# Patient Record
Sex: Female | Born: 2000 | Race: Black or African American | Hispanic: No | Marital: Single | Smoking: Never smoker
Health system: Southern US, Community
[De-identification: ages and names within clinical notes are randomized; demographics above are authoritative.]

## PROBLEM LIST (undated history)

## (undated) DIAGNOSIS — I509 Heart failure, unspecified: Secondary | ICD-10-CM

## (undated) HISTORY — PX: CARDIAC SURGERY: SHX584

---
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
## 2019-02-09 DIAGNOSIS — I401 Isolated myocarditis: Secondary | ICD-10-CM | POA: Diagnosis not present

## 2020-02-01 DIAGNOSIS — I401 Isolated myocarditis: Secondary | ICD-10-CM | POA: Diagnosis not present

## 2020-02-19 DIAGNOSIS — I401 Isolated myocarditis: Secondary | ICD-10-CM | POA: Diagnosis not present

## 2020-04-22 ENCOUNTER — Encounter: Payer: Self-pay | Admitting: Pediatrics

## 2021-01-29 DIAGNOSIS — I361 Nonrheumatic tricuspid (valve) insufficiency: Secondary | ICD-10-CM | POA: Diagnosis not present

## 2021-01-29 DIAGNOSIS — Z8679 Personal history of other diseases of the circulatory system: Secondary | ICD-10-CM | POA: Diagnosis not present

## 2021-01-29 DIAGNOSIS — I514 Myocarditis, unspecified: Secondary | ICD-10-CM | POA: Diagnosis not present

## 2021-02-07 DIAGNOSIS — I361 Nonrheumatic tricuspid (valve) insufficiency: Secondary | ICD-10-CM | POA: Diagnosis not present

## 2021-02-07 DIAGNOSIS — I514 Myocarditis, unspecified: Secondary | ICD-10-CM | POA: Diagnosis not present

## 2024-01-17 DIAGNOSIS — Z23 Encounter for immunization: Secondary | ICD-10-CM | POA: Diagnosis not present

## 2024-08-09 ENCOUNTER — Encounter: Payer: Self-pay | Admitting: Family Medicine

## 2024-08-09 ENCOUNTER — Ambulatory Visit: Payer: Self-pay | Admitting: Family Medicine

## 2024-08-09 ENCOUNTER — Ambulatory Visit (INDEPENDENT_AMBULATORY_CARE_PROVIDER_SITE_OTHER): Admitting: Family Medicine

## 2024-08-09 VITALS — BP 150/95 | HR 80 | Ht 63.0 in | Wt 316.4 lb

## 2024-08-09 DIAGNOSIS — Z13228 Encounter for screening for other metabolic disorders: Secondary | ICD-10-CM

## 2024-08-09 DIAGNOSIS — Z1329 Encounter for screening for other suspected endocrine disorder: Secondary | ICD-10-CM | POA: Diagnosis not present

## 2024-08-09 DIAGNOSIS — Z111 Encounter for screening for respiratory tuberculosis: Secondary | ICD-10-CM

## 2024-08-09 DIAGNOSIS — Z0000 Encounter for general adult medical examination without abnormal findings: Secondary | ICD-10-CM | POA: Diagnosis not present

## 2024-08-09 DIAGNOSIS — Z13220 Encounter for screening for lipoid disorders: Secondary | ICD-10-CM

## 2024-08-09 DIAGNOSIS — Z130 Encounter for screening for diseases of the blood and blood-forming organs and certain disorders involving the immune mechanism: Secondary | ICD-10-CM

## 2024-08-09 DIAGNOSIS — Z7689 Persons encountering health services in other specified circumstances: Secondary | ICD-10-CM | POA: Diagnosis not present

## 2024-08-09 DIAGNOSIS — Z1159 Encounter for screening for other viral diseases: Secondary | ICD-10-CM | POA: Diagnosis not present

## 2024-08-09 DIAGNOSIS — Z114 Encounter for screening for human immunodeficiency virus [HIV]: Secondary | ICD-10-CM | POA: Diagnosis not present

## 2024-08-09 NOTE — Progress Notes (Unsigned)
 New Patient Office Visit  Subjective    Patient ID: Elizabeth Wiley, female    DOB: 2001/12/06  Age: 23 y.o. MRN: [id number]  CC:  Chief Complaint  Patient presents with   Establish Care    HPI Elizabeth Wiley presents to establish care ***  Outpatient Encounter Medications as of 08/09/2024  Medication Sig   amoxicillin -clavulanate (AUGMENTIN ) 875-125 MG tablet Take 1 tablet by mouth every 12 (twelve) hours. (Patient not taking: Reported on 08/09/2024)   ferrous sulfate 324 (65 Fe) MG TBEC Take by mouth.   lisinopril (PRINIVIL,ZESTRIL) 5 MG tablet Take by mouth.   neomycin -polymyxin-hydrocortisone (CORTISPORIN) 3.5-10000-1 otic suspension Place 3 drops into the left ear 3 (three) times daily.   polyethylene glycol (MIRALAX  / GLYCOLAX ) packet Take by mouth.   spironolactone (ALDACTONE) 50 MG tablet Take by mouth.   No facility-administered encounter medications on file as of 08/09/2024.    Past Medical History:  Diagnosis Date   CHF (congestive heart failure) (HCC)     Past Surgical History:  Procedure Laterality Date   CARDIAC SURGERY      History reviewed. No pertinent family history.  Social History   Socioeconomic History   Marital status: Single    Spouse name: Not on file   Number of children: Not on file   Years of education: Not on file   Highest education level: Not on file  Occupational History   Not on file  Tobacco Use   Smoking status: Never   Smokeless tobacco: Never   Tobacco comments:    smoking outside   Substance and Sexual Activity   Alcohol use: No    Alcohol/week: 0.0 standard drinks of alcohol   Drug use: Not on file   Sexual activity: Not on file  Other Topics Concern   Not on file  Social History Narrative   Not on file   Social Drivers of Health   Financial Resource Strain: Low Risk  (08/09/2024)   Overall Financial Resource Strain (CARDIA)    Difficulty of Paying Living Expenses: Not hard at all  Food Insecurity: No Food  Insecurity (08/09/2024)   Hunger Vital Sign    Worried About Running Out of Food in the Last Year: Never true    Ran Out of Food in the Last Year: Never true  Transportation Needs: No Transportation Needs (08/09/2024)   PRAPARE - Administrator, Civil Service (Medical): No    Lack of Transportation (Non-Medical): No  Physical Activity: Insufficiently Active (08/09/2024)   Exercise Vital Sign    Days of Exercise per Week: 2 days    Minutes of Exercise per Session: 30 min  Stress: No Stress Concern Present (08/09/2024)   [employer] of Occupational Health - Occupational Stress Questionnaire    Feeling of Stress: Not at all  Social Connections: Moderately Isolated (08/09/2024)   Social Connection and Isolation Panel    Frequency of Communication with Friends and Family: More than three times a week    Frequency of Social Gatherings with Friends and Family: Twice a week    Attends Religious Services: More than 4 times per year    Active Member of [employer] or Organizations: No    Attends Banker Meetings: Never    Marital Status: Never married  Intimate Partner Violence: Not At Risk (08/09/2024)   Humiliation, Afraid, Rape, and Kick questionnaire    Fear of Current or Ex-Partner: No    Emotionally Abused: No  Physically Abused: No    Sexually Abused: No    ROS      Objective   BP (!) 150/95   Pulse 80   Ht 5' 3 (1.6 m)   Wt (!) 316 lb 6.4 oz (143.5 kg)   LMP 07/20/2024 (Exact Date)   SpO2 97%   BMI 56.05 kg/m   Physical Exam  {Labs (Optional):23779}    Assessment & Plan:   There are no diagnoses linked to this encounter.   No follow-ups on file.   Elizabeth Raguel SQUIBB, MD

## 2024-08-10 ENCOUNTER — Ambulatory Visit: Payer: Self-pay | Admitting: Family Medicine

## 2024-08-10 ENCOUNTER — Encounter: Payer: Self-pay | Admitting: Family Medicine

## 2024-08-10 LAB — CMP14+EGFR
ALT: 20 IU/L (ref 0–32)
AST: 18 IU/L (ref 0–40)
Albumin: 4.4 g/dL (ref 4.0–5.0)
Alkaline Phosphatase: 90 IU/L (ref 44–121)
BUN/Creatinine Ratio: 15 (ref 9–23)
BUN: 10 mg/dL (ref 6–20)
Bilirubin Total: 0.3 mg/dL (ref 0.0–1.2)
CO2: 21 mmol/L (ref 20–29)
Calcium: 9.6 mg/dL (ref 8.7–10.2)
Chloride: 100 mmol/L (ref 96–106)
Creatinine, Ser: 0.65 mg/dL (ref 0.57–1.00)
Globulin, Total: 3.5 g/dL (ref 1.5–4.5)
Glucose: 82 mg/dL (ref 70–99)
Potassium: 4.3 mmol/L (ref 3.5–5.2)
Sodium: 136 mmol/L (ref 134–144)
Total Protein: 7.9 g/dL (ref 6.0–8.5)
eGFR: 126 mL/min/1.73 (ref >59)

## 2024-08-10 LAB — CBC WITH DIFFERENTIAL/PLATELET
Basophils Absolute: 0.1 x10E3/uL (ref 0.0–0.2)
Basos: 1 %
EOS (ABSOLUTE): 0.2 x10E3/uL (ref 0.0–0.4)
Eos: 2 %
Hematocrit: 47.7 % — ABNORMAL HIGH (ref 34.0–46.6)
Hemoglobin: 14.8 g/dL (ref 11.1–15.9)
Immature Grans (Abs): 0 x10E3/uL (ref 0.0–0.1)
Immature Granulocytes: 0 %
Lymphocytes Absolute: 2.4 x10E3/uL (ref 0.7–3.1)
Lymphs: 28 %
MCH: 26.3 pg — ABNORMAL LOW (ref 26.6–33.0)
MCHC: 31 g/dL — ABNORMAL LOW (ref 31.5–35.7)
MCV: 85 fL (ref 79–97)
Monocytes Absolute: 0.5 x10E3/uL (ref 0.1–0.9)
Monocytes: 6 %
Neutrophils Absolute: 5.3 x10E3/uL (ref 1.4–7.0)
Neutrophils: 63 %
Platelets: 423 x10E3/uL (ref 150–450)
RBC: 5.62 x10E6/uL — ABNORMAL HIGH (ref 3.77–5.28)
RDW: 14.6 % (ref 11.7–15.4)
WBC: 8.4 x10E3/uL (ref 3.4–10.8)

## 2024-08-10 LAB — LIPID PANEL
Chol/HDL Ratio: 3.3 ratio (ref 0.0–4.4)
Cholesterol, Total: 221 mg/dL — ABNORMAL HIGH (ref 100–199)
HDL: 68 mg/dL (ref >39)
LDL Chol Calc (NIH): 136 mg/dL — ABNORMAL HIGH (ref 0–99)
Triglycerides: 96 mg/dL (ref 0–149)
VLDL Cholesterol Cal: 17 mg/dL (ref 5–40)

## 2024-08-10 LAB — TSH: TSH: 0.016 u[IU]/mL — ABNORMAL LOW (ref 0.450–4.500)

## 2024-08-10 LAB — HEMOGLOBIN A1C
Est. average glucose Bld gHb Est-mCnc: 117 mg/dL
Hgb A1c MFr Bld: 5.7 % — ABNORMAL HIGH (ref 4.8–5.6)

## 2024-08-10 LAB — HEPATITIS C ANTIBODY: Hep C Virus Ab: NONREACTIVE

## 2024-08-10 LAB — HIV ANTIBODY (ROUTINE TESTING W REFLEX): HIV Screen 4th Generation wRfx: NONREACTIVE

## 2024-08-10 NOTE — Progress Notes (Signed)
 Called [facility] and added test. They will fax over form for Dr. Tanda to sign

## 2024-08-11 ENCOUNTER — Telehealth: Payer: Self-pay | Admitting: Family Medicine

## 2024-08-11 NOTE — Telephone Encounter (Signed)
 A document form from [facility] has been faxed: Verbal order, to be filled out by provider. Send document back via Fax within 7-days. Document is located in providers tray at front office.          Fax number: [phone]

## 2024-08-11 NOTE — Telephone Encounter (Signed)
Placed in provider's office for signature. 

## 2024-08-12 LAB — QUANTIFERON-TB GOLD PLUS
QuantiFERON Mitogen Value: >10 [IU]/mL
QuantiFERON Nil Value: 0.31 [IU]/mL
QuantiFERON TB1 Ag Value: 0.12 [IU]/mL
QuantiFERON TB2 Ag Value: 0.12 [IU]/mL
QuantiFERON-TB Gold Plus: NEGATIVE

## 2024-08-22 NOTE — Telephone Encounter (Signed)
 Signed by Dr. Tanda and faxed back to [facility]

## 2024-08-23 LAB — T4, FREE: Free T4: 1.15 ng/dL (ref 0.82–1.77)

## 2024-08-23 LAB — SPECIMEN STATUS REPORT
